# Patient Record
Sex: Female | Born: 1955 | Race: Black or African American | Hispanic: Yes | Marital: Married | Smoking: Never smoker
Health system: Southern US, Community
[De-identification: ages and names within clinical notes are randomized; demographics above are authoritative.]

## PROBLEM LIST (undated history)

## (undated) DIAGNOSIS — I38 Endocarditis, valve unspecified: Secondary | ICD-10-CM

## (undated) DIAGNOSIS — I10 Essential (primary) hypertension: Secondary | ICD-10-CM

## (undated) DIAGNOSIS — J45909 Unspecified asthma, uncomplicated: Secondary | ICD-10-CM

---

## 2021-07-13 ENCOUNTER — Encounter: Payer: Self-pay | Admitting: Emergency Medicine

## 2021-07-13 ENCOUNTER — Emergency Department: Payer: Self-pay

## 2021-07-13 ENCOUNTER — Other Ambulatory Visit: Payer: Self-pay

## 2021-07-13 ENCOUNTER — Emergency Department
Admission: EM | Admit: 2021-07-13 | Discharge: 2021-07-13 | Disposition: A | Discharge status: Home / Self Care | Payer: Self-pay | Attending: Emergency Medicine | Admitting: Emergency Medicine

## 2021-07-13 DIAGNOSIS — M25512 Pain in left shoulder: Secondary | ICD-10-CM | POA: Insufficient documentation

## 2021-07-13 HISTORY — DX: Essential (primary) hypertension: I10

## 2021-07-13 MED ORDER — ACETAMINOPHEN 325 MG PO TABS
650.0000 mg | ORAL_TABLET | Freq: Once | ORAL | Status: AC
  Administered 2021-07-13: 650 mg via ORAL
  Filled 2021-07-13: qty 2

## 2021-07-13 NOTE — Discharge Instructions (Signed)
Your MRI will not be read tonight.  Please call for an appoint with Dr. Hyacinth Meeker.  You will see the results on your [facility] health MyChart as soon as the radiologist does read the result.  Wear the sling constantly for the next 2 to 3 days.  You may take it off only to shower.  Apply ice.  Take Tylenol and ibuprofen for pain as needed.

## 2021-07-13 NOTE — ED Provider Notes (Signed)
[facility] Provider Note    Event Date/Time   First MD Initiated Contact with Patient 07/13/21 1358     (approximate)   History   Shoulder Pain   HPI  Mackenzie Mills is a 66 y.o. female with no significant past medical history presents with left arm pain.  Patient states she was leaning on the arm to lift herself up when she felt a sharp pain in the upper arm and was unable to support herself.  States she has been unable to lift the arm without actually using the other hand.  Patient is left-handed.  States she took Motrin without much relief.  No numbness or tingling.      Physical Exam   Triage Vital Signs: ED Triage Vitals  Enc Vitals Group     BP 07/13/21 1356 (!) 170/88     Pulse Rate 07/13/21 1356 86     Resp 07/13/21 1356 18     Temp 07/13/21 1356 99 F (37.2 C)     Temp src --      SpO2 07/13/21 1356 96 %     Weight --      Height --      Head Circumference --      Peak Flow --      Pain Score 07/13/21 1351 10     Pain Loc --      Pain Edu? --      Excl. in [place]? --     Most recent vital signs: Vitals:   07/13/21 1356  BP: (!) 170/88  Pulse: 86  Resp: 18  Temp: 99 F (37.2 C)  SpO2: 96%     General: Awake, no distress.   CV:  Good peripheral perfusion. regular rate and  rhythm Resp:  Normal effort.  Abd:  No distention.   Other:  Left shoulder with decreased range of motion in all planes, patient is unable to lift her arm, there is a knot noted about halfway down the humerus which could be a piece of tendon or muscle.  Neurovascular is intact.   ED Results / Procedures / Treatments   Labs (all labs ordered are listed, but only abnormal results are displayed) Labs Reviewed - No data to display   EKG     RADIOLOGY X-ray of the left shoulder MRI of the left shoulder    PROCEDURES:   Procedures   MEDICATIONS ORDERED IN ED: Medications  acetaminophen (TYLENOL) tablet 650 mg (650 mg Oral Given 07/13/21 1633)      IMPRESSION / MDM / [place] / ED COURSE  I reviewed the triage vital signs and the nursing notes.                              Differential diagnosis includes, but is not limited to, fracture, rotator cuff injury, ruptured tendon or muscle  Patient's presentation is most consistent with acute complicated illness / injury requiring diagnostic workup.   X-ray of the left shoulder interpreted by me as being negative.  Due to the patient's inability to move the shoulder I do feel there is a tear of the muscle or tendon.  We will do MRI of the left shoulder.  Patient was placed in a sling and given ice pack.  Tylenol for pain as she does not want a narcotic.  MRI of left shoulder, patient placed in shoulder immobilizer and given ice pack.  She  states she is comfortable Tylenol and ibuprofen.  Discharged stable condition.  Instructed to follow-up with Dr. Sabra Heck and that she will get her MRI results tomorrow.       FINAL CLINICAL IMPRESSION(S) / ED DIAGNOSES   Final diagnoses:  Acute pain of left shoulder     Rx / DC Orders   ED Discharge Orders     None        Note:  This document was prepared using Dragon voice recognition software and may include unintentional dictation errors.    Versie Starks, PA-C 07/13/21 2033    Carrie Mew, MD 07/19/21 [phone]

## 2021-07-13 NOTE — ED Triage Notes (Signed)
Pt reports was getting up from the ground and using her left arm to support her and all the sudden she felt a sharp pain and since then she has not been able to use her left arm and lift it. Pt reports pain is right at shoulder. Pt in sling made from t-shirt at this time. Pt reports feels better supported

## 2022-05-13 ENCOUNTER — Other Ambulatory Visit: Payer: Self-pay

## 2022-05-13 ENCOUNTER — Emergency Department: Payer: Medicare Other

## 2022-05-13 ENCOUNTER — Encounter: Payer: Self-pay | Admitting: Intensive Care

## 2022-05-13 ENCOUNTER — Emergency Department
Admission: EM | Admit: 2022-05-13 | Discharge: 2022-05-13 | Disposition: A | Discharge status: Home / Self Care | Payer: Medicare Other | Attending: Emergency Medicine | Admitting: Emergency Medicine

## 2022-05-13 DIAGNOSIS — I10 Essential (primary) hypertension: Secondary | ICD-10-CM | POA: Diagnosis not present

## 2022-05-13 DIAGNOSIS — K5730 Diverticulosis of large intestine without perforation or abscess without bleeding: Secondary | ICD-10-CM | POA: Diagnosis not present

## 2022-05-13 DIAGNOSIS — R0789 Other chest pain: Secondary | ICD-10-CM | POA: Diagnosis not present

## 2022-05-13 DIAGNOSIS — R0602 Shortness of breath: Secondary | ICD-10-CM | POA: Insufficient documentation

## 2022-05-13 DIAGNOSIS — I7090 Unspecified atherosclerosis: Secondary | ICD-10-CM | POA: Diagnosis not present

## 2022-05-13 DIAGNOSIS — R079 Chest pain, unspecified: Secondary | ICD-10-CM | POA: Diagnosis not present

## 2022-05-13 HISTORY — DX: Unspecified asthma, uncomplicated: J45.909

## 2022-05-13 HISTORY — DX: Endocarditis, valve unspecified: I38

## 2022-05-13 LAB — CBC
HCT: 34.6 % — ABNORMAL LOW (ref 36.0–46.0)
Hemoglobin: 11.2 g/dL — ABNORMAL LOW (ref 12.0–15.0)
MCH: 28.4 pg (ref 26.0–34.0)
MCHC: 32.4 g/dL (ref 30.0–36.0)
MCV: 87.6 fL (ref 80.0–100.0)
Platelets: 304 10*3/uL (ref 150–400)
RBC: 3.95 MIL/uL (ref 3.87–5.11)
RDW: 14.4 % (ref 11.5–15.5)
WBC: 5.3 10*3/uL (ref 4.0–10.5)
nRBC: 0 % (ref 0.0–0.2)

## 2022-05-13 LAB — BASIC METABOLIC PANEL
Anion gap: 10 (ref 5–15)
BUN: 14 mg/dL (ref 8–23)
CO2: 26 mmol/L (ref 22–32)
Calcium: 9.1 mg/dL (ref 8.9–10.3)
Chloride: 102 mmol/L (ref 98–111)
Creatinine, Ser: 0.71 mg/dL (ref 0.44–1.00)
GFR, Estimated: >60 mL/min (ref >60)
Glucose, Bld: 101 mg/dL — ABNORMAL HIGH (ref 70–99)
Potassium: 3.5 mmol/L (ref 3.5–5.1)
Sodium: 138 mmol/L (ref 135–145)

## 2022-05-13 LAB — URINALYSIS, ROUTINE W REFLEX MICROSCOPIC
Bilirubin Urine: NEGATIVE
Glucose, UA: NEGATIVE mg/dL
Ketones, ur: NEGATIVE mg/dL
Leukocytes,Ua: NEGATIVE
Nitrite: NEGATIVE
Protein, ur: NEGATIVE mg/dL
Specific Gravity, Urine: 1.002 — ABNORMAL LOW (ref 1.005–1.030)
Squamous Epithelial / HPF: NONE SEEN /HPF (ref 0–5)
pH: 7 (ref 5.0–8.0)

## 2022-05-13 LAB — TROPONIN I (HIGH SENSITIVITY)
Troponin I (High Sensitivity): 4 ng/L (ref <18)
Troponin I (High Sensitivity): 5 ng/L (ref <18)

## 2022-05-13 MED ORDER — LISINOPRIL-HYDROCHLOROTHIAZIDE 20-25 MG PO TABS: 1.0000 | ORAL_TABLET | Freq: Every day | ORAL | 1 refills | Status: AC

## 2022-05-13 MED ORDER — IOHEXOL 350 MG/ML SOLN
100.0000 mL | Freq: Once | INTRAVENOUS | Status: AC | PRN
  Administered 2022-05-13: 100 mL via INTRAVENOUS

## 2022-05-13 MED ORDER — PENICILLIN V POTASSIUM 500 MG PO TABS: 500.0000 mg | ORAL_TABLET | Freq: Four times a day (QID) | ORAL | 0 refills | Status: AC

## 2022-05-13 MED ORDER — HYDROCHLOROTHIAZIDE 25 MG PO TABS
25.0000 mg | ORAL_TABLET | Freq: Every day | ORAL | Status: DC
  Administered 2022-05-13: 25 mg via ORAL
  Filled 2022-05-13: qty 1

## 2022-05-13 MED ORDER — LISINOPRIL 10 MG PO TABS
20.0000 mg | ORAL_TABLET | Freq: Once | ORAL | Status: AC
  Administered 2022-05-13: 20 mg via ORAL
  Filled 2022-05-13: qty 2

## 2022-05-13 MED ORDER — LISINOPRIL 10 MG PO TABS
40.0000 mg | ORAL_TABLET | Freq: Once | ORAL | Status: DC
  Filled 2022-05-13: qty 4

## 2022-05-13 NOTE — ED Triage Notes (Signed)
Patient c/o squeezing chest pain with sob. No radiation

## 2022-05-13 NOTE — ED Notes (Signed)
Patient transported to CT 

## 2022-05-13 NOTE — ED Notes (Addendum)
Pt A&Ox4. 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 Thought it would would decrease but worsened. Denies any CP, SOB or headache at this time. Pt should be on BP meds HTZ and lisinopril but has been out of meds for a week or so. Pt also states sensitive to salt. Also states gains weight quickly from HTZ meds. BP has elevated at this time. EDP at bedside at this time. Pt currently has infected tooth in mouth and denies any pain from it at this time is due to have it removed within a weeks time.

## 2022-05-13 NOTE — ED Provider Notes (Addendum)
-----------------------------------------   5:30 PM on 05/13/2022 ----------------------------------------- Patient care assumed from Dr. Erma Heritage.  Patient's repeat troponin remains negative.  CTA of the chest abdomen pelvis is negative for aortic dissection or aneurysm.  Small umbilical hernia without obstruction.  Multiple pulmonary nodules.  Patient will follow-up with her doctor regarding these to see if any follow-up is needed.  Given the patient's reassuring workup reassuring lab work I believe the patient is safe for discharge home from an emergent standpoint.  Patient will follow-up with her primary care doctor.  Patient states she is having a dental extraction soon and has been experiencing increased dental pain.  Will cover with penicillin as a precaution.  I have also put a referral in for a primary care doctor.  Patient agreeable to plan of care.  She states she does not smoke and has never smoked, low risk will not need CT follow-up.   Minna Antis, MD 05/13/22 1737

## 2022-05-13 NOTE — ED Provider Notes (Addendum)
[facility] Provider Note    Event Date/Time   First MD Initiated Contact with Patient 05/13/22 1332     (approximate)   History   Chest Pain   HPI  Mackenzie Mills is a 67 y.o. female  here with chest pain. Pt reports that earlier today at work, she experienced gradual onset of aching, throbbing chest pain and pressure. She states she has been out of her BP meds x 1-2 weeks. She states that it was a squeezing like sensation. She took her BP and It was 170s/100s which is very high for her. Tried to rest but remained elevated so she is here for evaluation. Denies any current CP, SOB. No focal numbness or weakness. H/o HTN but has not had a PCP since moving here 2-3 years ago. No other complaints.      Physical Exam   Triage Vital Signs: ED Triage Vitals  Enc Vitals Group     BP 05/13/22 1314 (!) 165/87     Pulse Rate 05/13/22 1314 89     Resp 05/13/22 1314 16     Temp 05/13/22 1314 98.5 F (36.9 C)     Temp Source 05/13/22 1314 Oral     SpO2 05/13/22 1314 98 %     Weight 05/13/22 1309 167 lb (75.8 kg)     Height 05/13/22 1309 5' 4.5" (1.638 m)     Head Circumference --      Peak Flow --      Pain Score 05/13/22 1309 8     Pain Loc --      Pain Edu? --      Excl. in [place]? --     Most recent vital signs: Vitals:   05/13/22 1352 05/13/22 1400  BP:  (!) 178/88  Pulse:  87  Resp:    Temp:    SpO2: 99% 98%     General: Awake, no distress.  CV:  Good peripheral perfusion. RRR. Resp:  Normal work of breathing. Lungs clear. Abd:  No distention. No tenderness. Other:  No focal neuro deficits. Pulses 2+ and symmetric.   ED Results / Procedures / Treatments   Labs (all labs ordered are listed, but only abnormal results are displayed) Labs Reviewed  BASIC METABOLIC PANEL - Abnormal; Notable for the following components:      Result Value   Glucose, Bld 101 (*)    All other components within normal limits  CBC - Abnormal; Notable for the  following components:   Hemoglobin 11.2 (*)    HCT 34.6 (*)    All other components within normal limits  URINALYSIS, ROUTINE W REFLEX MICROSCOPIC  TROPONIN I (HIGH SENSITIVITY)  TROPONIN I (HIGH SENSITIVITY)     EKG Sinus rhythm, VR 90. PR 154, QRS 86, QTc 445. No acute St elevations or depressions. No ischemia or infarct.   RADIOLOGY CXR: Clear   I also independently reviewed and agree with radiologist interpretations.   PROCEDURES:  Critical Care performed: No  .1-3 Lead EKG Interpretation  Performed by: Shaune Pollack, MD Authorized by: Shaune Pollack, MD     Interpretation: normal     ECG rate:  80-90   ECG rate assessment: normal     Rhythm: sinus rhythm     Ectopy: none     Conduction: normal   Comments:     Indication: Chest pain     MEDICATIONS ORDERED IN ED: Medications  hydrochlorothiazide (HYDRODIURIL) tablet 25 mg (has no administration in time  range)  lisinopril (ZESTRIL) tablet 20 mg (has no administration in time range)     IMPRESSION / MDM / ASSESSMENT AND PLAN / ED COURSE  I reviewed the triage vital signs and the nursing notes.                              Differential diagnosis includes, but is not limited to, symptomatic HTN, ACS, GERD, anemia, anxiety, arrhythmia  Patient's presentation is most consistent with acute presentation with potential threat to life or bodily function.  The patient is on the cardiac monitor to evaluate for evidence of arrhythmia and/or significant heart rate changes  67 yo F with h/o HTN here with atypical chest pain. Suspect symptomatic HTN vs atypical chest pain. No signs of ST elevation. Initial trop negative. CXR is clear. CBC, BMP unremarkable.  Will plan to repeat trop, dc with antiHTN if pain remains under control and BP improved. Pt in agreement. Pain is not sharp or tearing, pulses symmetric, do not suspect dissection. No hypoxia or signs of PE.    Of note, pt also has a h/o poor dentition and  has an active dental infection. Upcoming removal in 1 week. She has a h/o valvular disease of unknown severity. Will start on empiric abx.  FINAL CLINICAL IMPRESSION(S) / ED DIAGNOSES   Final diagnoses:  Atypical chest pain     Rx / DC Orders   ED Discharge Orders     None        Note:  This document was prepared using Dragon voice recognition software and may include unintentional dictation errors.   Shaune Pollack, MD 05/13/22 1448    Shaune Pollack, MD 05/13/22 [phone]

## 2022-05-13 NOTE — Discharge Instructions (Signed)
Please follow-up with your doctor for recheck/reevaluation.  Return to the emergency department for any return of/worsening chest pain, trouble breathing, or any other symptom personally concerning to yourself.

## 2022-05-19 DIAGNOSIS — H04123 Dry eye syndrome of bilateral lacrimal glands: Secondary | ICD-10-CM | POA: Diagnosis not present

## 2022-05-20 DIAGNOSIS — J00 Acute nasopharyngitis [common cold]: Secondary | ICD-10-CM | POA: Diagnosis not present

## 2022-05-20 DIAGNOSIS — R21 Rash and other nonspecific skin eruption: Secondary | ICD-10-CM | POA: Diagnosis not present

## 2022-06-02 DIAGNOSIS — D509 Iron deficiency anemia, unspecified: Secondary | ICD-10-CM | POA: Diagnosis not present

## 2022-06-02 DIAGNOSIS — M25561 Pain in right knee: Secondary | ICD-10-CM | POA: Diagnosis not present

## 2022-06-02 DIAGNOSIS — R918 Other nonspecific abnormal finding of lung field: Secondary | ICD-10-CM | POA: Diagnosis not present

## 2022-06-02 DIAGNOSIS — Z1211 Encounter for screening for malignant neoplasm of colon: Secondary | ICD-10-CM | POA: Diagnosis not present

## 2022-06-02 DIAGNOSIS — R0789 Other chest pain: Secondary | ICD-10-CM | POA: Diagnosis not present

## 2022-06-02 DIAGNOSIS — I10 Essential (primary) hypertension: Secondary | ICD-10-CM | POA: Diagnosis not present

## 2022-06-02 DIAGNOSIS — E559 Vitamin D deficiency, unspecified: Secondary | ICD-10-CM | POA: Diagnosis not present

## 2022-06-02 DIAGNOSIS — E538 Deficiency of other specified B group vitamins: Secondary | ICD-10-CM | POA: Diagnosis not present

## 2022-06-02 DIAGNOSIS — K429 Umbilical hernia without obstruction or gangrene: Secondary | ICD-10-CM | POA: Diagnosis not present

## 2022-06-02 DIAGNOSIS — Z8739 Personal history of other diseases of the musculoskeletal system and connective tissue: Secondary | ICD-10-CM | POA: Diagnosis not present

## 2022-06-02 DIAGNOSIS — Z0000 Encounter for general adult medical examination without abnormal findings: Secondary | ICD-10-CM | POA: Diagnosis not present

## 2022-06-02 DIAGNOSIS — J4520 Mild intermittent asthma, uncomplicated: Secondary | ICD-10-CM | POA: Diagnosis not present

## 2022-06-10 DIAGNOSIS — R0600 Dyspnea, unspecified: Secondary | ICD-10-CM | POA: Diagnosis not present

## 2022-06-10 DIAGNOSIS — I700 Atherosclerosis of aorta: Secondary | ICD-10-CM | POA: Diagnosis not present

## 2022-06-10 DIAGNOSIS — I2089 Other forms of angina pectoris: Secondary | ICD-10-CM | POA: Diagnosis not present

## 2022-06-10 DIAGNOSIS — R002 Palpitations: Secondary | ICD-10-CM | POA: Diagnosis not present

## 2022-06-10 DIAGNOSIS — R296 Repeated falls: Secondary | ICD-10-CM | POA: Diagnosis not present

## 2022-06-12 DIAGNOSIS — Z1211 Encounter for screening for malignant neoplasm of colon: Secondary | ICD-10-CM | POA: Diagnosis not present

## 2022-06-17 DIAGNOSIS — M170 Bilateral primary osteoarthritis of knee: Secondary | ICD-10-CM | POA: Diagnosis not present

## 2022-06-17 DIAGNOSIS — M25562 Pain in left knee: Secondary | ICD-10-CM | POA: Diagnosis not present

## 2022-06-17 DIAGNOSIS — M19072 Primary osteoarthritis, left ankle and foot: Secondary | ICD-10-CM | POA: Diagnosis not present

## 2022-06-17 DIAGNOSIS — M25572 Pain in left ankle and joints of left foot: Secondary | ICD-10-CM | POA: Diagnosis not present

## 2022-06-17 DIAGNOSIS — Z780 Asymptomatic menopausal state: Secondary | ICD-10-CM | POA: Diagnosis not present

## 2022-06-17 DIAGNOSIS — M25561 Pain in right knee: Secondary | ICD-10-CM | POA: Diagnosis not present

## 2022-06-22 DIAGNOSIS — R002 Palpitations: Secondary | ICD-10-CM | POA: Diagnosis not present

## 2022-07-07 DIAGNOSIS — E538 Deficiency of other specified B group vitamins: Secondary | ICD-10-CM | POA: Diagnosis not present

## 2022-07-07 DIAGNOSIS — D509 Iron deficiency anemia, unspecified: Secondary | ICD-10-CM | POA: Diagnosis not present

## 2022-07-07 DIAGNOSIS — Z8739 Personal history of other diseases of the musculoskeletal system and connective tissue: Secondary | ICD-10-CM | POA: Diagnosis not present

## 2022-07-07 DIAGNOSIS — I10 Essential (primary) hypertension: Secondary | ICD-10-CM | POA: Diagnosis not present

## 2022-07-07 DIAGNOSIS — E559 Vitamin D deficiency, unspecified: Secondary | ICD-10-CM | POA: Diagnosis not present

## 2022-07-07 DIAGNOSIS — R7309 Other abnormal glucose: Secondary | ICD-10-CM | POA: Diagnosis not present

## 2022-07-21 DIAGNOSIS — M170 Bilateral primary osteoarthritis of knee: Secondary | ICD-10-CM | POA: Diagnosis not present

## 2022-08-25 DIAGNOSIS — R0602 Shortness of breath: Secondary | ICD-10-CM | POA: Diagnosis not present

## 2022-08-25 DIAGNOSIS — R002 Palpitations: Secondary | ICD-10-CM | POA: Diagnosis not present

## 2022-08-25 DIAGNOSIS — R079 Chest pain, unspecified: Secondary | ICD-10-CM | POA: Diagnosis not present

## 2022-08-25 DIAGNOSIS — I700 Atherosclerosis of aorta: Secondary | ICD-10-CM | POA: Diagnosis not present

## 2022-08-25 DIAGNOSIS — R011 Cardiac murmur, unspecified: Secondary | ICD-10-CM | POA: Diagnosis not present

## 2022-08-25 DIAGNOSIS — I10 Essential (primary) hypertension: Secondary | ICD-10-CM | POA: Diagnosis not present

## 2022-09-03 ENCOUNTER — Other Ambulatory Visit: Payer: Self-pay

## 2022-09-03 ENCOUNTER — Emergency Department
Admission: EM | Admit: 2022-09-03 | Discharge: 2022-09-03 | Disposition: A | Discharge status: Home / Self Care | Payer: Medicare Other | Attending: Emergency Medicine | Admitting: Emergency Medicine

## 2022-09-03 ENCOUNTER — Emergency Department: Payer: Medicare Other

## 2022-09-03 DIAGNOSIS — U071 COVID-19: Secondary | ICD-10-CM | POA: Insufficient documentation

## 2022-09-03 DIAGNOSIS — J45909 Unspecified asthma, uncomplicated: Secondary | ICD-10-CM | POA: Diagnosis not present

## 2022-09-03 DIAGNOSIS — R059 Cough, unspecified: Secondary | ICD-10-CM | POA: Diagnosis present

## 2022-09-03 DIAGNOSIS — R0602 Shortness of breath: Secondary | ICD-10-CM | POA: Diagnosis not present

## 2022-09-03 DIAGNOSIS — R000 Tachycardia, unspecified: Secondary | ICD-10-CM | POA: Diagnosis not present

## 2022-09-03 DIAGNOSIS — I517 Cardiomegaly: Secondary | ICD-10-CM | POA: Diagnosis not present

## 2022-09-03 DIAGNOSIS — E872 Acidosis, unspecified: Secondary | ICD-10-CM | POA: Diagnosis not present

## 2022-09-03 LAB — CBC WITH DIFFERENTIAL/PLATELET
Abs Immature Granulocytes: 0.02 10*3/uL (ref 0.00–0.07)
Basophils Absolute: 0 10*3/uL (ref 0.0–0.1)
Basophils Relative: 0 %
Eosinophils Absolute: 0 10*3/uL (ref 0.0–0.5)
Eosinophils Relative: 0 %
HCT: 34.6 % — ABNORMAL LOW (ref 36.0–46.0)
Hemoglobin: 11.5 g/dL — ABNORMAL LOW (ref 12.0–15.0)
Immature Granulocytes: 0 %
Lymphocytes Relative: 5 %
Lymphs Abs: 0.3 10*3/uL — ABNORMAL LOW (ref 0.7–4.0)
MCH: 28.3 pg (ref 26.0–34.0)
MCHC: 33.2 g/dL (ref 30.0–36.0)
MCV: 85.2 fL (ref 80.0–100.0)
Monocytes Absolute: 0.1 10*3/uL (ref 0.1–1.0)
Monocytes Relative: 1 %
Neutro Abs: 6.5 10*3/uL (ref 1.7–7.7)
Neutrophils Relative %: 94 %
Platelets: 303 10*3/uL (ref 150–400)
RBC: 4.06 MIL/uL (ref 3.87–5.11)
RDW: 14.2 % (ref 11.5–15.5)
WBC: 7 10*3/uL (ref 4.0–10.5)
nRBC: 0 % (ref 0.0–0.2)

## 2022-09-03 LAB — URINALYSIS, W/ REFLEX TO CULTURE (INFECTION SUSPECTED)
Bilirubin Urine: NEGATIVE
Glucose, UA: NEGATIVE mg/dL
Ketones, ur: NEGATIVE mg/dL
Leukocytes,Ua: NEGATIVE
Nitrite: NEGATIVE
Protein, ur: NEGATIVE mg/dL
Specific Gravity, Urine: 1.008 (ref 1.005–1.030)
pH: 5 (ref 5.0–8.0)

## 2022-09-03 LAB — SARS CORONAVIRUS 2 BY RT PCR: SARS Coronavirus 2 by RT PCR: POSITIVE — AB

## 2022-09-03 LAB — COMPREHENSIVE METABOLIC PANEL
ALT: 18 U/L (ref 0–44)
AST: 31 U/L (ref 15–41)
Albumin: 3.8 g/dL (ref 3.5–5.0)
Alkaline Phosphatase: 54 U/L (ref 38–126)
Anion gap: 12 (ref 5–15)
BUN: 15 mg/dL (ref 8–23)
CO2: 25 mmol/L (ref 22–32)
Calcium: 9.1 mg/dL (ref 8.9–10.3)
Chloride: 98 mmol/L (ref 98–111)
Creatinine, Ser: 0.99 mg/dL (ref 0.44–1.00)
GFR, Estimated: >60 mL/min (ref >60)
Glucose, Bld: 207 mg/dL — ABNORMAL HIGH (ref 70–99)
Potassium: 3.4 mmol/L — ABNORMAL LOW (ref 3.5–5.1)
Sodium: 135 mmol/L (ref 135–145)
Total Bilirubin: 0.4 mg/dL (ref 0.3–1.2)
Total Protein: 8.1 g/dL (ref 6.5–8.1)

## 2022-09-03 LAB — LACTIC ACID, PLASMA
Lactic Acid, Venous: 4 mmol/L (ref 0.5–1.9)
Lactic Acid, Venous: 3.7 mmol/L (ref 0.5–1.9)
Lactic Acid, Venous: 3.9 mmol/L (ref 0.5–1.9)

## 2022-09-03 MED ORDER — LACTATED RINGERS IV BOLUS
1000.0000 mL | Freq: Once | INTRAVENOUS | Status: AC
  Administered 2022-09-03: 1000 mL via INTRAVENOUS

## 2022-09-03 MED ORDER — IPRATROPIUM-ALBUTEROL 0.5-2.5 (3) MG/3ML IN SOLN
3.0000 mL | Freq: Once | RESPIRATORY_TRACT | Status: AC
  Administered 2022-09-03: 3 mL via RESPIRATORY_TRACT
  Filled 2022-09-03: qty 3

## 2022-09-03 MED ORDER — ACETAMINOPHEN 500 MG PO TABS
1000.0000 mg | ORAL_TABLET | Freq: Once | ORAL | Status: AC
  Administered 2022-09-03: 1000 mg via ORAL
  Filled 2022-09-03: qty 2

## 2022-09-03 NOTE — ED Provider Notes (Signed)
[facility] Provider Note    Event Date/Time   First MD Initiated Contact with Patient 09/03/22 1614     (approximate)   History   Cough, Shortness of Breath, and Fatigue   HPI  Mackenzie Mills is a 67 y.o. female who presents to the emergency department today because of concerns for shortness of breath, cough and fatigue.  Symptoms started roughly 5 days ago.  Patient does have a history of asthma so has tried her inhaler with minimal relief.  In addition to the symptoms she also has felt increased urinary urgency.  She states that she was up and down to the bathroom multiple times last night.     Physical Exam   Triage Vital Signs: ED Triage Vitals  Encounter Vitals Group     BP 09/03/22 1607 (!) 151/89     Systolic BP Percentile --      Diastolic BP Percentile --      Pulse Rate 09/03/22 1607 (!) 117     Resp 09/03/22 1607 17     Temp 09/03/22 1607 100 F (37.8 C)     Temp Source 09/03/22 1607 Oral     SpO2 09/03/22 1607 97 %     Weight 09/03/22 1609 167 lb (75.8 kg)     Height 09/03/22 1609 5\' 4"  (1.626 m)     Head Circumference --      Peak Flow --      Pain Score 09/03/22 1608 0     Pain Loc --      Pain Education --      Exclude from Growth Chart --     Most recent vital signs: Vitals:   09/03/22 1607  BP: (!) 151/89  Pulse: (!) 117  Resp: 17  Temp: 100 F (37.8 C)  SpO2: 97%   General: Awake, alert, oriented. CV:  Good peripheral perfusion. Tachycardia. Resp:  Normal effort. Lungs clear. Abd:  No distention.    ED Results / Procedures / Treatments   Labs (all labs ordered are listed, but only abnormal results are displayed) Labs Reviewed  SARS CORONAVIRUS 2 BY RT PCR - Abnormal; Notable for the following components:      Result Value   SARS Coronavirus 2 by RT PCR POSITIVE (*)    All other components within normal limits  COMPREHENSIVE METABOLIC PANEL - Abnormal; Notable for the following components:   Potassium 3.4  (*)    Glucose, Bld 207 (*)    All other components within normal limits  CBC WITH DIFFERENTIAL/PLATELET - Abnormal; Notable for the following components:   Hemoglobin 11.5 (*)    HCT 34.6 (*)    Lymphs Abs 0.3 (*)    All other components within normal limits  CULTURE, BLOOD (ROUTINE X 2)  CULTURE, BLOOD (ROUTINE X 2)  LACTIC ACID, PLASMA  LACTIC ACID, PLASMA  URINALYSIS, W/ REFLEX TO CULTURE (INFECTION SUSPECTED)     EKG  I, Phineas Semen, attending physician, personally viewed and interpreted this EKG  EKG Time: 1559 Rate: 118 Rhythm: sinus tachycardia Axis: normal Intervals: qtc 487 QRS: narrow, q waves II, III aVF ST changes: no st elevation Impression: abnormal ekg    RADIOLOGY I independently interpreted and visualized the CXR. My interpretation: No pneumonia Radiology interpretation:  IMPRESSION:  1. No acute cardiopulmonary process.  2. Mild to moderate cardiomegaly.      PROCEDURES:  Critical Care performed: No    MEDICATIONS ORDERED IN ED: Medications - No data  to display   IMPRESSION / MDM / ASSESSMENT AND PLAN / ED COURSE  I reviewed the triage vital signs and the nursing notes.                              Differential diagnosis includes, but is not limited to, pneumonia, COVID, viral URI, asthma  Patient's presentation is most consistent with acute presentation with potential threat to life or bodily function.   The patient is on the cardiac monitor to evaluate for evidence of arrhythmia and/or significant heart rate changes.  Patient presented to the emergency department today because of concerns for cough, shortness of breath and weakness for roughly 5 days.  On exam lungs were clear.  Blood work without leukocytosis.  COVID-positive.  Will give DuoNeb treatments.   Patient did feel better after DuoNeb treatments.  However patient's lactic acid did come back significantly elevated.  Did give IV fluids however repeat lactic still  without significant improvement.  I did have a discussion with patient given concern for possible significant infection or dehydration.  However patient was adamant that she felt significantly improved and would like to be discharged home.  She states that she has an appointment scheduled for 10:00 tomorrow.  Did discuss strict return precautions.      FINAL CLINICAL IMPRESSION(S) / ED DIAGNOSES   Final diagnoses:  COVID-19  Lactic acidosis      Note:  This document was prepared using Dragon voice recognition software and may include unintentional dictation errors.    Phineas Semen, MD 09/03/22 [phone]

## 2022-09-03 NOTE — ED Triage Notes (Signed)
Pt arrives via POV w/ c/o fatigue, shortness of breath, and cough that started Monday.

## 2022-09-03 NOTE — Discharge Instructions (Signed)
Please seek medical attention for any high fevers, chest pain, shortness of breath, change in behavior, persistent vomiting, bloody stool or any other new or concerning symptoms.  

## 2022-09-03 NOTE — ED Notes (Signed)
Patient ambulatory to toilet without assistance to give urine sample.

## 2022-09-04 DIAGNOSIS — J208 Acute bronchitis due to other specified organisms: Secondary | ICD-10-CM | POA: Diagnosis not present

## 2022-09-04 DIAGNOSIS — R7989 Other specified abnormal findings of blood chemistry: Secondary | ICD-10-CM | POA: Diagnosis not present

## 2022-09-04 DIAGNOSIS — U071 COVID-19: Secondary | ICD-10-CM | POA: Diagnosis not present

## 2022-09-04 DIAGNOSIS — J4521 Mild intermittent asthma with (acute) exacerbation: Secondary | ICD-10-CM | POA: Diagnosis not present

## 2022-09-11 ENCOUNTER — Telehealth: Payer: Self-pay | Admitting: *Deleted

## 2022-09-11 DIAGNOSIS — I10 Essential (primary) hypertension: Secondary | ICD-10-CM | POA: Diagnosis not present

## 2022-09-11 DIAGNOSIS — R0789 Other chest pain: Secondary | ICD-10-CM | POA: Diagnosis not present

## 2022-09-11 DIAGNOSIS — I700 Atherosclerosis of aorta: Secondary | ICD-10-CM | POA: Diagnosis not present

## 2022-09-11 NOTE — Telephone Encounter (Signed)
Transition Care Management Unsuccessful Follow-up Telephone Call  Date of discharge and from where:  [facility] 09/03/2022  Attempts:  2nd Attempt  Reason for unsuccessful TCM follow-up call:  No answer/busy

## 2022-09-24 DIAGNOSIS — J208 Acute bronchitis due to other specified organisms: Secondary | ICD-10-CM | POA: Diagnosis not present

## 2022-09-24 DIAGNOSIS — R7303 Prediabetes: Secondary | ICD-10-CM | POA: Diagnosis not present

## 2022-09-24 DIAGNOSIS — U071 COVID-19: Secondary | ICD-10-CM | POA: Diagnosis not present

## 2022-09-24 DIAGNOSIS — R0789 Other chest pain: Secondary | ICD-10-CM | POA: Diagnosis not present

## 2022-09-28 DIAGNOSIS — I10 Essential (primary) hypertension: Secondary | ICD-10-CM | POA: Diagnosis not present

## 2022-09-28 DIAGNOSIS — I700 Atherosclerosis of aorta: Secondary | ICD-10-CM | POA: Diagnosis not present

## 2022-09-28 DIAGNOSIS — I38 Endocarditis, valve unspecified: Secondary | ICD-10-CM | POA: Diagnosis not present

## 2022-09-28 DIAGNOSIS — I517 Cardiomegaly: Secondary | ICD-10-CM | POA: Diagnosis not present

## 2022-09-28 DIAGNOSIS — I119 Hypertensive heart disease without heart failure: Secondary | ICD-10-CM | POA: Diagnosis not present

## 2022-09-28 DIAGNOSIS — R079 Chest pain, unspecified: Secondary | ICD-10-CM | POA: Diagnosis not present

## 2022-10-13 DIAGNOSIS — J4520 Mild intermittent asthma, uncomplicated: Secondary | ICD-10-CM | POA: Diagnosis not present

## 2022-10-13 DIAGNOSIS — M545 Low back pain, unspecified: Secondary | ICD-10-CM | POA: Diagnosis not present

## 2022-12-13 ENCOUNTER — Other Ambulatory Visit: Payer: Self-pay

## 2022-12-13 ENCOUNTER — Emergency Department
Admission: EM | Admit: 2022-12-13 | Discharge: 2022-12-13 | Disposition: A | Discharge status: Home / Self Care | Payer: Medicare Other | Attending: Emergency Medicine | Admitting: Emergency Medicine

## 2022-12-13 ENCOUNTER — Emergency Department: Payer: Medicare Other

## 2022-12-13 DIAGNOSIS — I10 Essential (primary) hypertension: Secondary | ICD-10-CM | POA: Insufficient documentation

## 2022-12-13 DIAGNOSIS — J45909 Unspecified asthma, uncomplicated: Secondary | ICD-10-CM | POA: Insufficient documentation

## 2022-12-13 DIAGNOSIS — I771 Stricture of artery: Secondary | ICD-10-CM | POA: Diagnosis not present

## 2022-12-13 DIAGNOSIS — I517 Cardiomegaly: Secondary | ICD-10-CM | POA: Diagnosis not present

## 2022-12-13 DIAGNOSIS — R079 Chest pain, unspecified: Secondary | ICD-10-CM | POA: Insufficient documentation

## 2022-12-13 DIAGNOSIS — R55 Syncope and collapse: Secondary | ICD-10-CM | POA: Diagnosis not present

## 2022-12-13 DIAGNOSIS — R002 Palpitations: Secondary | ICD-10-CM | POA: Diagnosis not present

## 2022-12-13 DIAGNOSIS — I6782 Cerebral ischemia: Secondary | ICD-10-CM | POA: Diagnosis not present

## 2022-12-13 LAB — CBC
HCT: 35 % — ABNORMAL LOW (ref 36.0–46.0)
Hemoglobin: 11.4 g/dL — ABNORMAL LOW (ref 12.0–15.0)
MCH: 28.4 pg (ref 26.0–34.0)
MCHC: 32.6 g/dL (ref 30.0–36.0)
MCV: 87.1 fL (ref 80.0–100.0)
Platelets: 306 10*3/uL (ref 150–400)
RBC: 4.02 MIL/uL (ref 3.87–5.11)
RDW: 13.8 % (ref 11.5–15.5)
WBC: 4.9 10*3/uL (ref 4.0–10.5)
nRBC: 0 % (ref 0.0–0.2)

## 2022-12-13 LAB — CBG MONITORING, ED: Glucose-Capillary: 111 mg/dL — ABNORMAL HIGH (ref 70–99)

## 2022-12-13 LAB — BASIC METABOLIC PANEL
Anion gap: 7 (ref 5–15)
BUN: 12 mg/dL (ref 8–23)
CO2: 30 mmol/L (ref 22–32)
Calcium: 9.3 mg/dL (ref 8.9–10.3)
Chloride: 100 mmol/L (ref 98–111)
Creatinine, Ser: 0.8 mg/dL (ref 0.44–1.00)
GFR, Estimated: >60 mL/min (ref >60)
Glucose, Bld: 105 mg/dL — ABNORMAL HIGH (ref 70–99)
Potassium: 3.4 mmol/L — ABNORMAL LOW (ref 3.5–5.1)
Sodium: 137 mmol/L (ref 135–145)

## 2022-12-13 LAB — TROPONIN I (HIGH SENSITIVITY)
Troponin I (High Sensitivity): 4 ng/L (ref <18)
Troponin I (High Sensitivity): 5 ng/L (ref <18)

## 2022-12-13 NOTE — ED Provider Notes (Signed)
[facility] Provider Note    Event Date/Time   First MD Initiated Contact with Patient 12/13/22 1143     (approximate)   History   Chest Pain   HPI  Mackenzie Mills is a 67 y.o. female history of asthma and hypertension  She has noticed a slight feeling of a fluttering feeling intermittently in her chest now off and on for quite some time, last night also noticed a slight fluttering feeling.  It lasted perhaps 20 seconds.  This morning she was preparing for her church community to leave the house when she started feeling the fluttering followed by lightheadedness and nearly "passed out".  She called for her husband.  She did not fall or suffer injury.  She had to go down to the floor, and checks her blood pressure not long after and reports it was normal in the 140 range.  She felt a little lightheaded as well and  She denies having any chest pain.  No shortness of breath.  She reports rather it feels like a lightheadedness that had come about her and a slight fluttering feeling in the chest that happens.    Patient reports she has also had follow-up with [facility] cardiology.  It appears that she had a recent echo cardiogram, also had exercise stress testing that was reassuring.  Cardiology has been working to minimize her risk for atherosclerotic heart disease but she does not carry a history of known coronary disease or dysrhythmia  There is no nausea or vomiting.  No abdominal pain.  No lower extremity swelling.  No history of blood clots.  Physical Exam   Triage Vital Signs: ED Triage Vitals [12/13/22 1051]  Encounter Vitals Group     BP (!) 163/86     Systolic BP Percentile      Diastolic BP Percentile      Pulse Rate 90     Resp 18     Temp 98.1 F (36.7 C)     Temp src      SpO2 100 %     Weight 163 lb (73.9 kg)     Height 5\' 4"  (1.626 m)     Head Circumference      Peak Flow      Pain Score 0     Pain Loc      Pain Education      Exclude  from Growth Chart     Most recent vital signs: Vitals:   12/13/22 1051 12/13/22 1430  BP: (!) 163/86 (!) 140/87  Pulse: 90 91  Resp: 18 17  Temp: 98.1 F (36.7 C) 97.9 F (36.6 C)  SpO2: 100% 96%     General: Awake, no distress.  Normocephalic atraumatic.  Cranial nerve examination grossly intact including normal smile normal extraocular movements.  Speech is very clear and oriented.  No pronator drift in any extremity.  Normal sensation across the face arms and legs. CV:  Good peripheral perfusion.  Normal tones and rate.  Reviewed telemetry tracing as well and no cardiac dysrhythmias noted on telemetry Resp:  Normal effort.  Clear bilateral Abd:  No distention.  Soft nontender nondistended Other:  No pedal edema, no lateral edema   ED Results / Procedures / Treatments   Labs (all labs ordered are listed, but only abnormal results are displayed) Labs Reviewed  BASIC METABOLIC PANEL - Abnormal; Notable for the following components:      Result Value   Potassium 3.4 (*)  Glucose, Bld 105 (*)    All other components within normal limits  CBC - Abnormal; Notable for the following components:   Hemoglobin 11.4 (*)    HCT 35.0 (*)    All other components within normal limits  CBG MONITORING, ED - Abnormal; Notable for the following components:   Glucose-Capillary 111 (*)    All other components within normal limits  TROPONIN I (HIGH SENSITIVITY)  TROPONIN I (HIGH SENSITIVITY)   Initial troponin is normal.  Repeat troponin is normal  Normal chemistry panel with exception of very mild hypokalemia.  Mild anemia-chronic  EKG  Interpreted by me at 11 AM heart rate 80 QRS 90 QTc 430 Normal sinus rhythm, no evidence of acute ischemia   RADIOLOGY  Chest x-ray interpreted by me as negative for acute finding.  Cardiomegaly is present, chronic   PROCEDURES:  Critical Care performed: No  Procedures   MEDICATIONS ORDERED IN ED: Medications - No data to  display   IMPRESSION / MDM / ASSESSMENT AND PLAN / ED COURSE  I reviewed the triage vital signs and the nursing notes.                              Differential diagnosis includes, but is not limited to, orthostasis, episode of hypotension, dysrhythmia, less likely cause such as ACS.  She has no major identifiable risk factors for thromboembolism has no associated chest pain leg swelling or obvious risk factor that would suggest that as potential cause.  Her workup today is quite reassuring her ECG with normal sinus rhythm her telemetry tracing here reviewed does not show any obvious dysrhythmia.  She did report a fluttering sensation at times  off and on now for quite some time, there was a slight feeling of fluttering as well associated with today's near syncopal episode.  She did not suffer a fall or injury.  I discussed her case with her cardiology team, and Dr.Custovik and after discussing her case clinical history current workup she advises follow-up plan to have the patient see her Wednesday in clinic for consideration of cardiac monitoring/Holter monitoring.  Patient is currently awake alert has been up and ambulated, reports he feels well at this time with no further symptoms.  Discussed further plan of care and follow-up with cardiology team and she is very agreeable.  The interim we will take careful precautions.  Suspect most likely orthostatic or hypotensive type episode that is now resolved, but also consideration for arrhythmia is strongly considered.  She has no central neurologic abnormality very reassuring examination chest pain and reassuring workup  Patient's presentation is most consistent with acute complicated illness / injury requiring diagnostic workup.   The patient is on the cardiac monitor to evaluate for evidence of arrhythmia and/or significant heart rate changes.       FINAL CLINICAL IMPRESSION(S) / ED DIAGNOSES   Final diagnoses:  Near syncope   Palpitation   Rx / DC Orders   ED Discharge Orders          Ordered    Ambulatory referral to Cardiology        12/13/22 1354             Note:  This document was prepared using Dragon voice recognition software and may include unintentional dictation errors.   Sharyn Creamer, MD 12/13/22 [phone]

## 2022-12-13 NOTE — ED Notes (Signed)
EDP at bedside  

## 2022-12-13 NOTE — ED Triage Notes (Addendum)
Pt comes with c/o cp that started at 4am. Pt states it did go away and then later it came back. Pt states she later felt feeling like she was going to pass out. Pt states she felt something was happening with her heart then and not sure if PVC. Pt states she does have heart hx  Pt states chest pain is mid sternal when it happens.  Pt states also not sure if it was stroke like. Pt states no dizziness, slurred speech, blurry vision or numbness. Pt states she just felt like her brain wasn't doing something right.

## 2022-12-13 NOTE — ED Notes (Signed)
Pt walked self to hall bathroom. Refused to use toilet in room. Pt noted to have steady gait.

## 2022-12-13 NOTE — ED Notes (Signed)
Pt walked back to room with steady gait

## 2022-12-21 DIAGNOSIS — I517 Cardiomegaly: Secondary | ICD-10-CM | POA: Diagnosis not present

## 2022-12-21 DIAGNOSIS — I700 Atherosclerosis of aorta: Secondary | ICD-10-CM | POA: Diagnosis not present

## 2022-12-21 DIAGNOSIS — R079 Chest pain, unspecified: Secondary | ICD-10-CM | POA: Diagnosis not present

## 2022-12-21 DIAGNOSIS — R55 Syncope and collapse: Secondary | ICD-10-CM | POA: Diagnosis not present

## 2022-12-21 DIAGNOSIS — I10 Essential (primary) hypertension: Secondary | ICD-10-CM | POA: Diagnosis not present

## 2022-12-21 DIAGNOSIS — I771 Stricture of artery: Secondary | ICD-10-CM | POA: Diagnosis not present

## 2022-12-30 ENCOUNTER — Telehealth: Payer: Self-pay

## 2022-12-30 DIAGNOSIS — R55 Syncope and collapse: Secondary | ICD-10-CM | POA: Diagnosis not present

## 2022-12-30 DIAGNOSIS — R079 Chest pain, unspecified: Secondary | ICD-10-CM | POA: Diagnosis not present

## 2022-12-30 NOTE — Telephone Encounter (Signed)
Transition Care Management Unsuccessful Follow-up Telephone Call  Date of discharge and from where:  12/13/2022 [facility]  Attempts:  1st Attempt  Reason for unsuccessful TCM follow-up call:  Left voice message  [place] Toor [facility]  [facility], [facility] Resource Care Guide Direct Dial: [phone]  Website: [email or web address]

## 2023-01-04 ENCOUNTER — Telehealth: Payer: Self-pay

## 2023-01-04 NOTE — Telephone Encounter (Signed)
Transition Care Management Unsuccessful Follow-up Telephone Call  Date of discharge and from where:  12/13/2022 [facility]  Attempts:  2nd Attempt  Reason for unsuccessful TCM follow-up call:  Left voice message  Videl Nobrega [facility]  [facility], [facility] Resource Care Guide Direct Dial: [phone]  Website: [email or web address]

## 2023-01-07 DIAGNOSIS — R55 Syncope and collapse: Secondary | ICD-10-CM | POA: Diagnosis not present

## 2023-05-13 DIAGNOSIS — M170 Bilateral primary osteoarthritis of knee: Secondary | ICD-10-CM | POA: Diagnosis not present

## 2023-05-13 DIAGNOSIS — M25561 Pain in right knee: Secondary | ICD-10-CM | POA: Diagnosis not present

## 2023-05-13 DIAGNOSIS — M25551 Pain in right hip: Secondary | ICD-10-CM | POA: Diagnosis not present

## 2023-05-31 DIAGNOSIS — M51362 Other intervertebral disc degeneration, lumbar region with discogenic back pain and lower extremity pain: Secondary | ICD-10-CM | POA: Diagnosis not present

## 2023-05-31 DIAGNOSIS — M7061 Trochanteric bursitis, right hip: Secondary | ICD-10-CM | POA: Diagnosis not present

## 2023-05-31 DIAGNOSIS — M25551 Pain in right hip: Secondary | ICD-10-CM | POA: Diagnosis not present

## 2023-05-31 DIAGNOSIS — G8929 Other chronic pain: Secondary | ICD-10-CM | POA: Diagnosis not present

## 2023-05-31 DIAGNOSIS — M48062 Spinal stenosis, lumbar region with neurogenic claudication: Secondary | ICD-10-CM | POA: Diagnosis not present

## 2023-06-02 DIAGNOSIS — H2513 Age-related nuclear cataract, bilateral: Secondary | ICD-10-CM | POA: Diagnosis not present

## 2024-04-25 IMAGING — MR MR SHOULDER*L* W/O CM
4 of 5 series · 31 of 40 positions shown · non-contrast
Comparison: Radiographs 07/13/2021

CLINICAL DATA: Left shoulder pain after an injury while pushing up
from the ground.

EXAM:
MRI OF THE LEFT SHOULDER WITHOUT CONTRAST
TECHNIQUE: Multiplanar, multisequence MR imaging of the shoulder was performed.
No intravenous contrast was administered.

[Series 5: T2 fat-sat · axial · left · 4.0mm · 0.44mm/px · z∈[-70,+41]mm · 8 of 26 slices shown (1 of 3)]
[im 1/26]
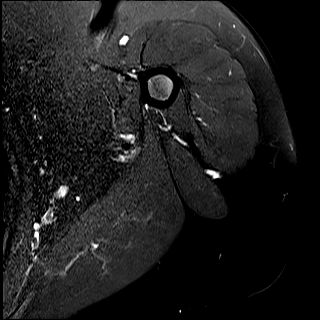
[im 4/26]
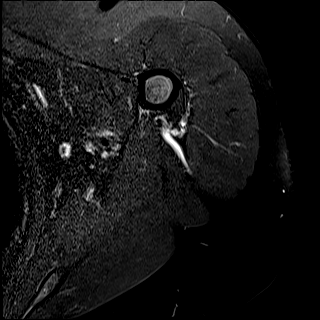
[im 8/26]
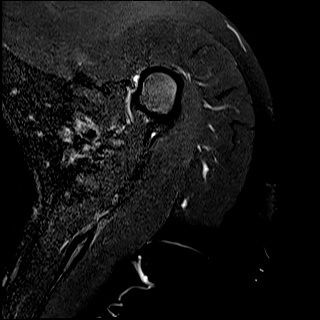
[im 11/26]
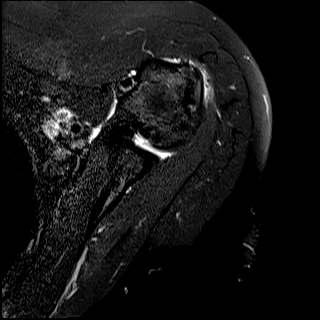
[im 15/26]
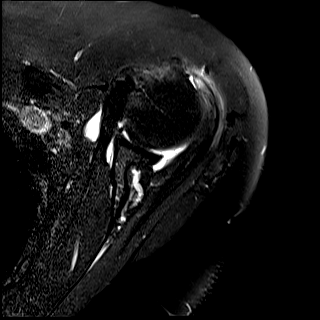
[im 18/26]
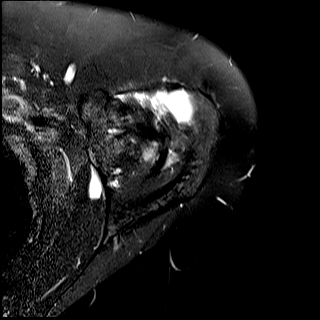
[im 22/26]
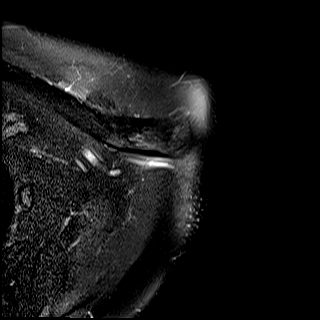
[im 26/26]
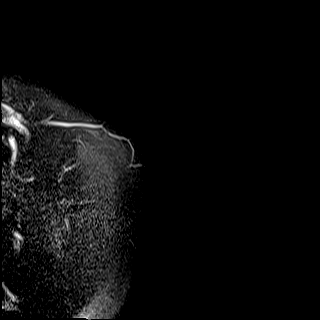

[Series 6: PD · sagittal · left · 4.0mm · 0.44mm/px · 9 of 26 slices shown]
[im 1/26]
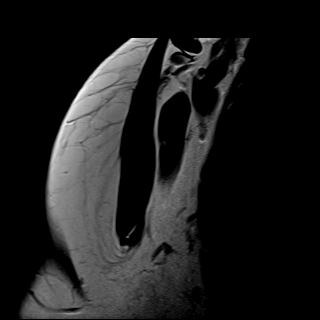
[im 4/26]
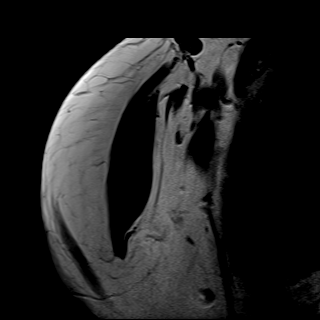
[im 7/26]
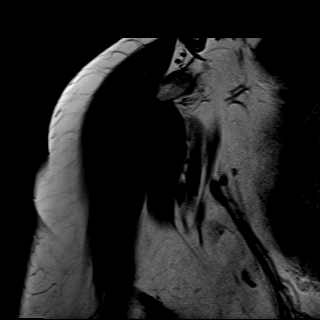
[im 10/26]
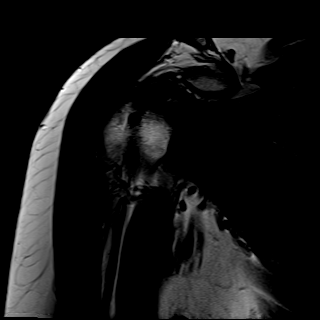
[im 13/26]
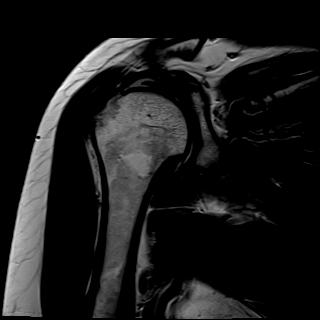
[im 16/26]
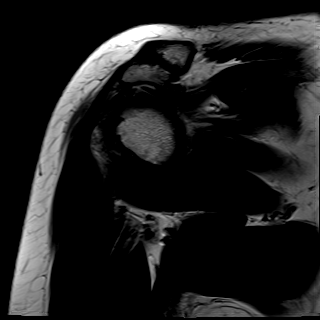
[im 19/26]
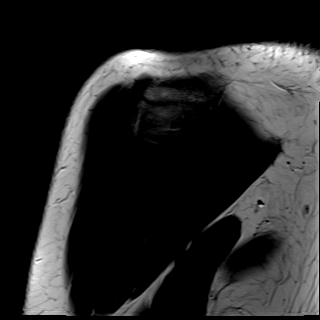
[im 22/26]
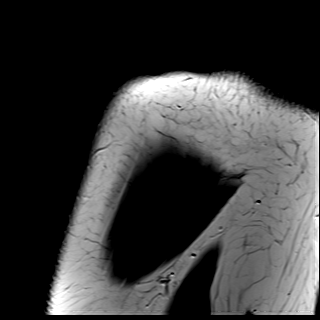
[im 26/26]
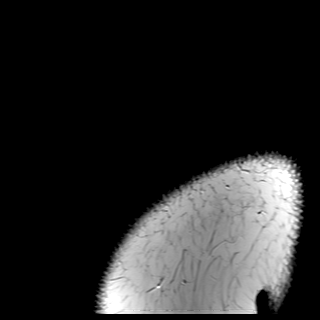

[Series 12: T2 fat-sat · sagittal · left · 4.0mm · 0.44mm/px · 9 of 26 slices shown (2 of 3)]
[im 1/26]
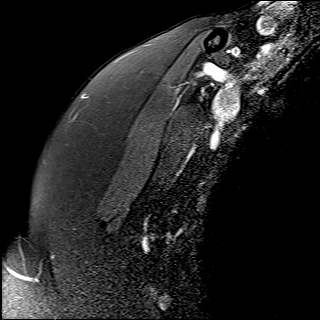
[im 4/26]
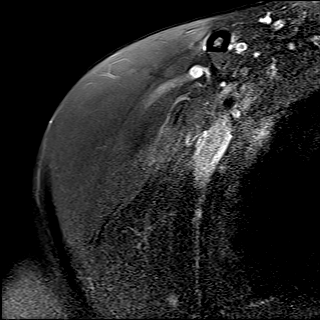
[im 7/26]
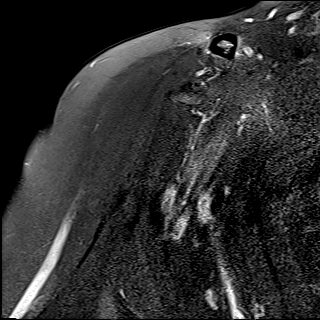
[im 10/26]
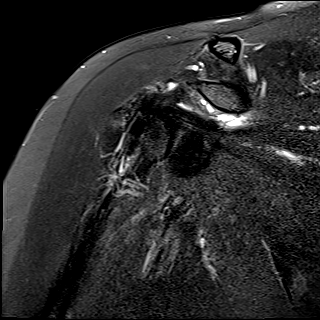
[im 13/26]
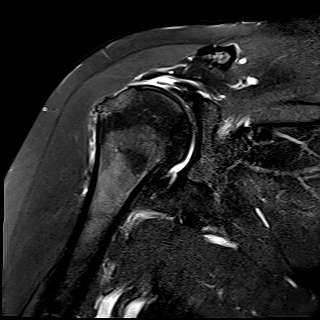
[im 16/26]
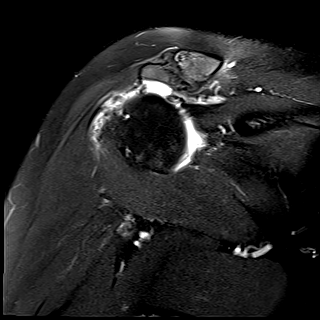
[im 19/26]
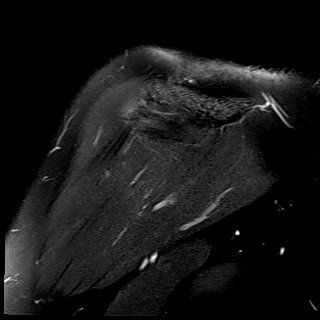
[im 22/26]
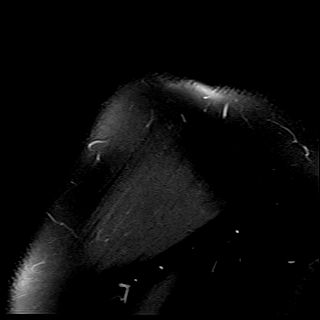
[im 26/26]
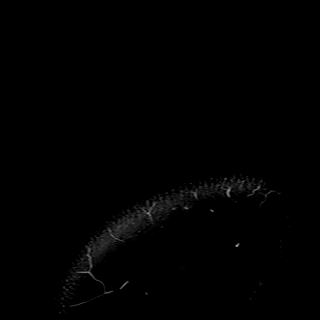

[Series 13: T2 fat-sat · oblique · left · 4.0mm · 0.22mm/px · 5 of 22 slices shown (3 of 3)]
[im 1/22]
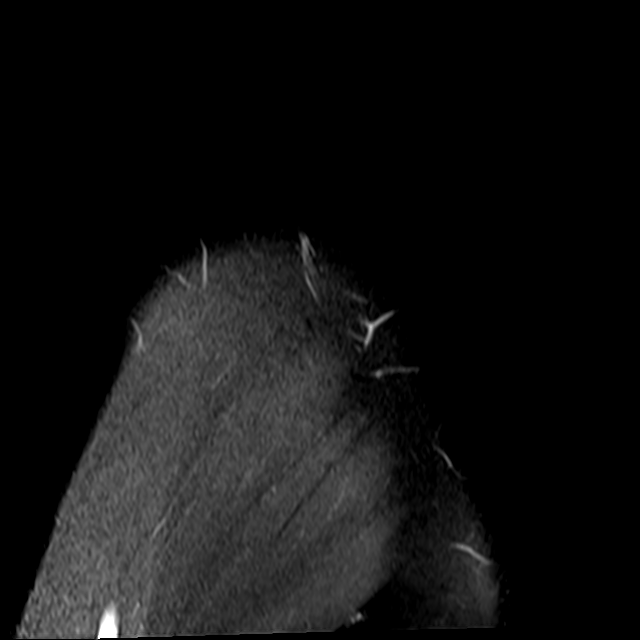
[im 4/22]
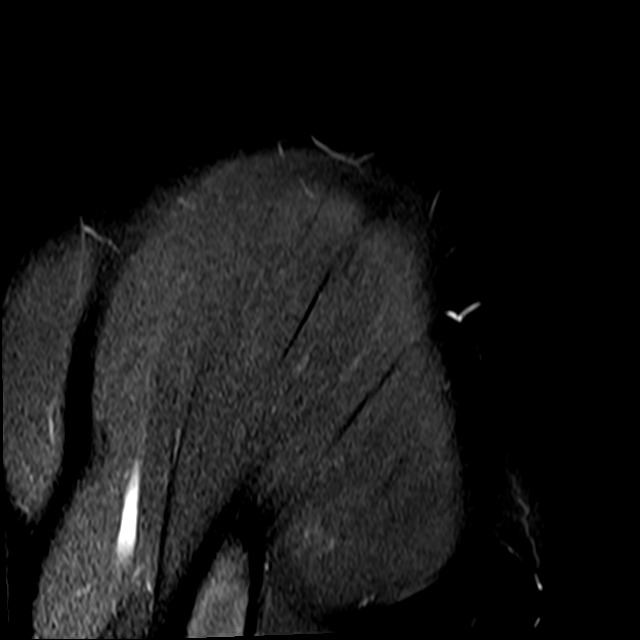
[im 8/22]
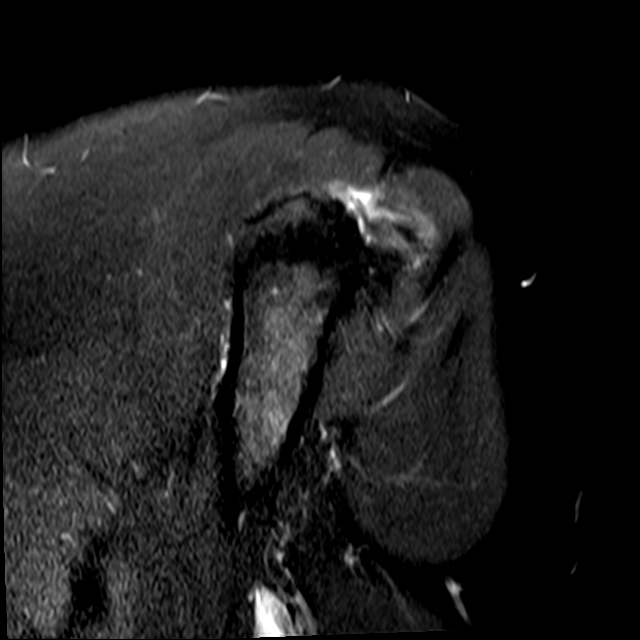
[im 11/22]
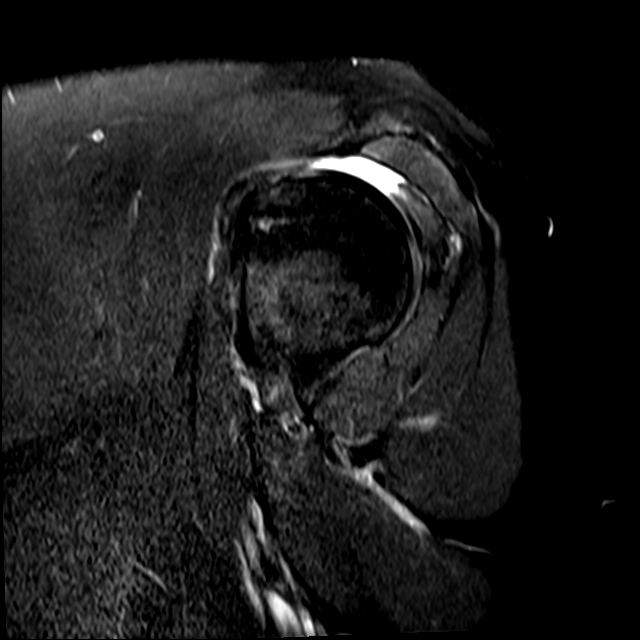
[im 18/22]
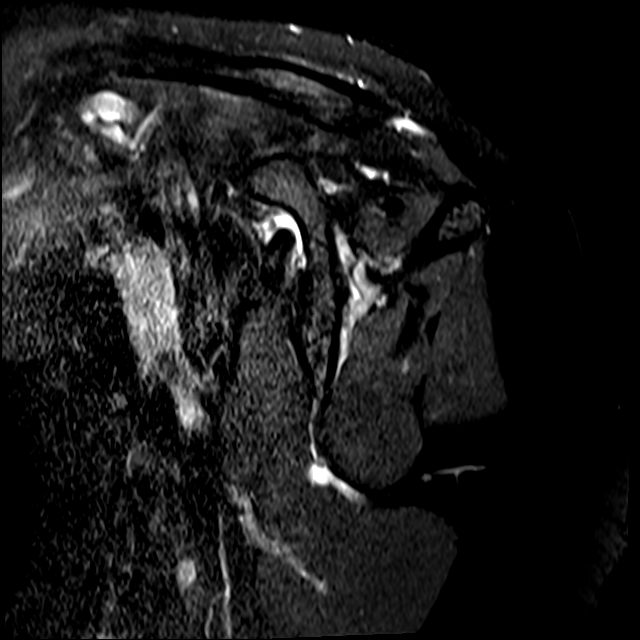

[31 of 40 positions shown; findings below may reference images not displayed]

FINDINGS: The patient tells the shoulder in an unusually internally rotated
position during the exam.

Rotator cuff: Full-thickness full width rupture of the supraspinatus
tendon, with the supraspinatus retracted about 3.5 cm nearly to the
plane of the glenoid.

Moderate to prominent distal infraspinatus tendinopathy.

Muscles:  Unremarkable

Biceps long head: Moderate tendinopathy of the intra-articular
segment.

Acromioclavicular Joint: Moderate spurring and mild subcortical
marrow edema. Type II acromion. As expected, there is a small amount
of fluid in the subacromial subdeltoid bursa and the subcoracoid
bursa.

Glenohumeral Joint: Mild degenerative chondral thinning.

Labrum:  Unremarkable

Bones: No significant extra-articular osseous abnormalities
identified.

Other: No supplemental non-categorized findings.
IMPRESSION: 1. Full-thickness full width rupture of the supraspinatus tendon,
retracted back 3.5 cm. No current supraspinatus muscular atrophy or
edema.
2. Moderate to prominent distal infraspinatus tendinopathy.
3. Moderate biceps tendinopathy.
4. Moderate degenerative AC joint arthropathy. Mild degenerative
glenohumeral chondral thinning.

## 2024-04-25 IMAGING — CR DG SHOULDER 2+V*L*
3 series · 3 of 3 positions shown · non-contrast
Comparison: None Available.

CLINICAL DATA: Shoulder injury with pain.

EXAM:
LEFT SHOULDER - 2+ VIEW

[shoulder ap neutral]
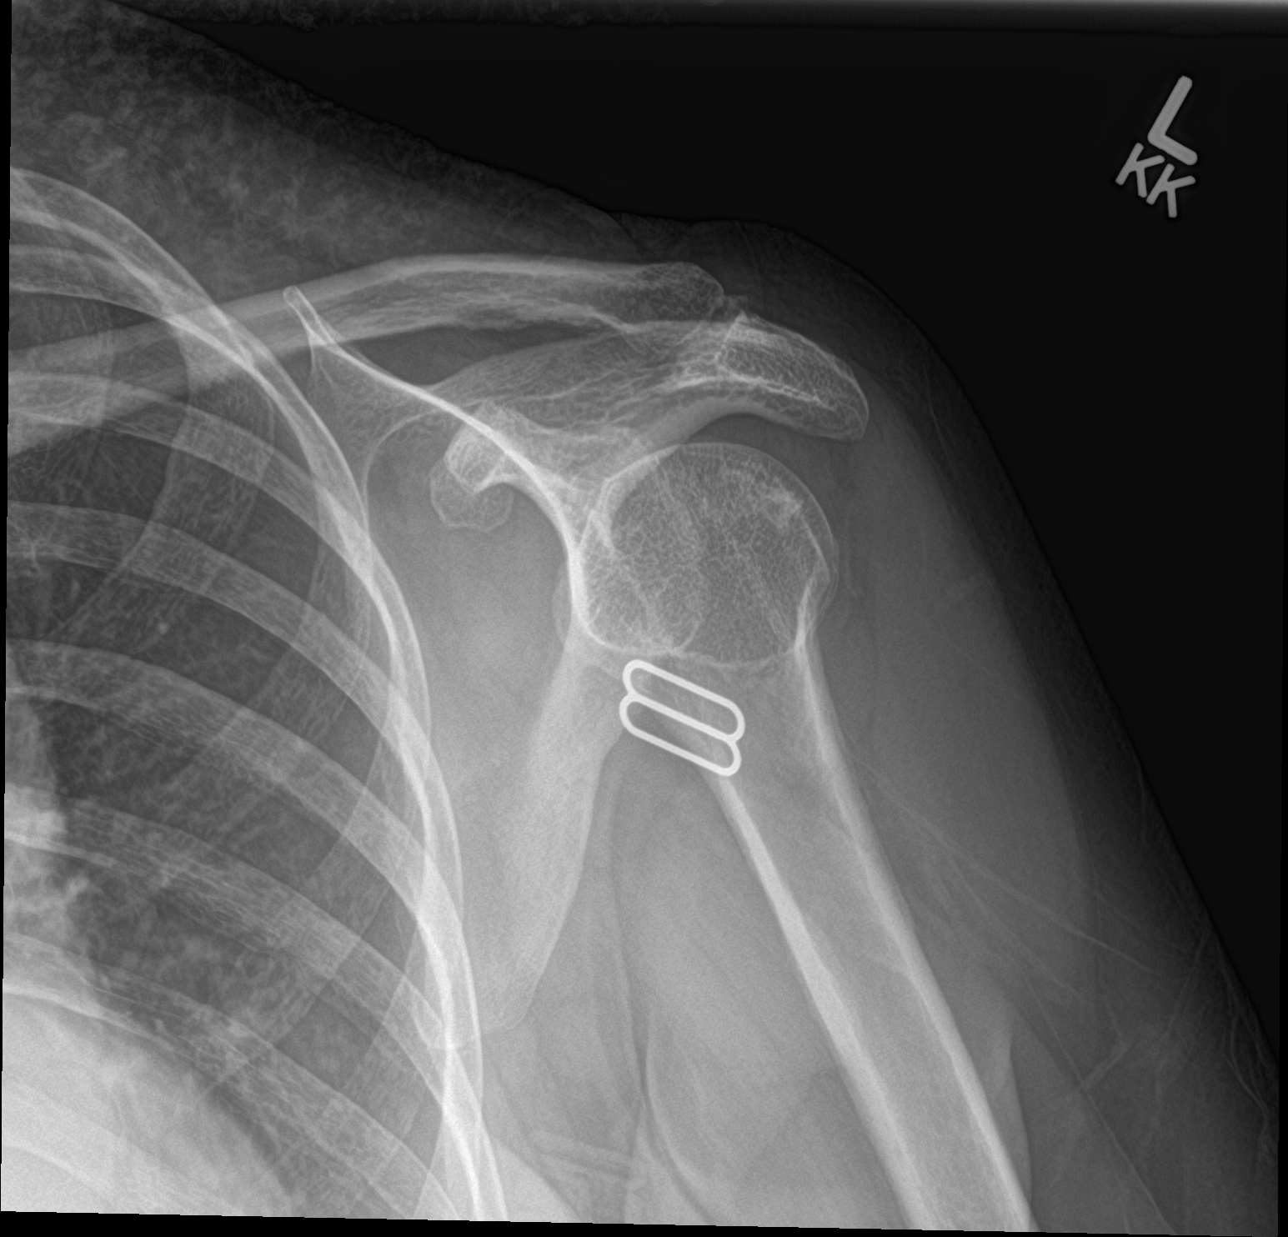

[shoulder y view]
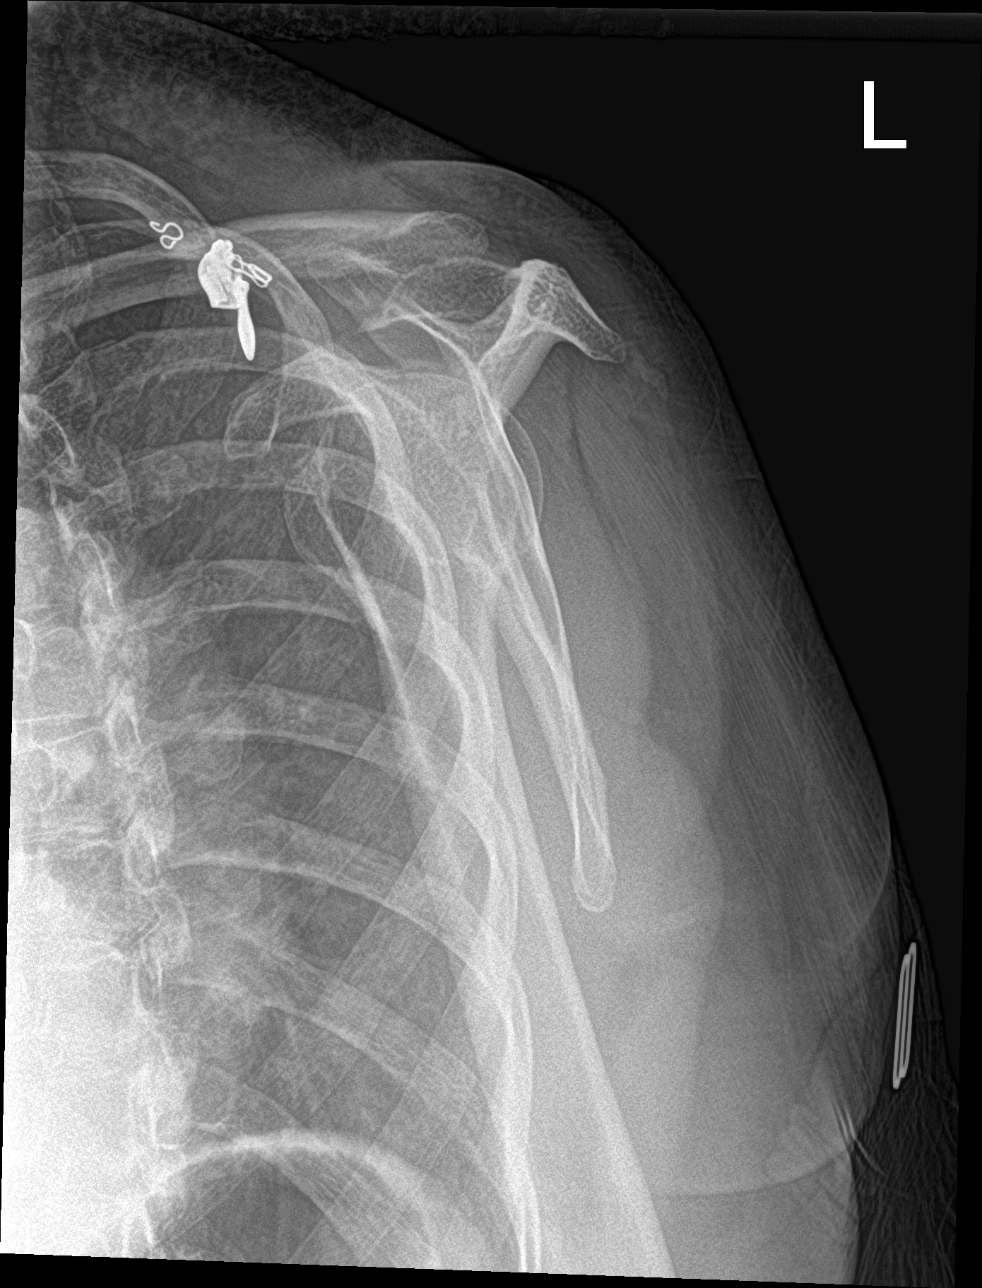

[shoulder grashey]
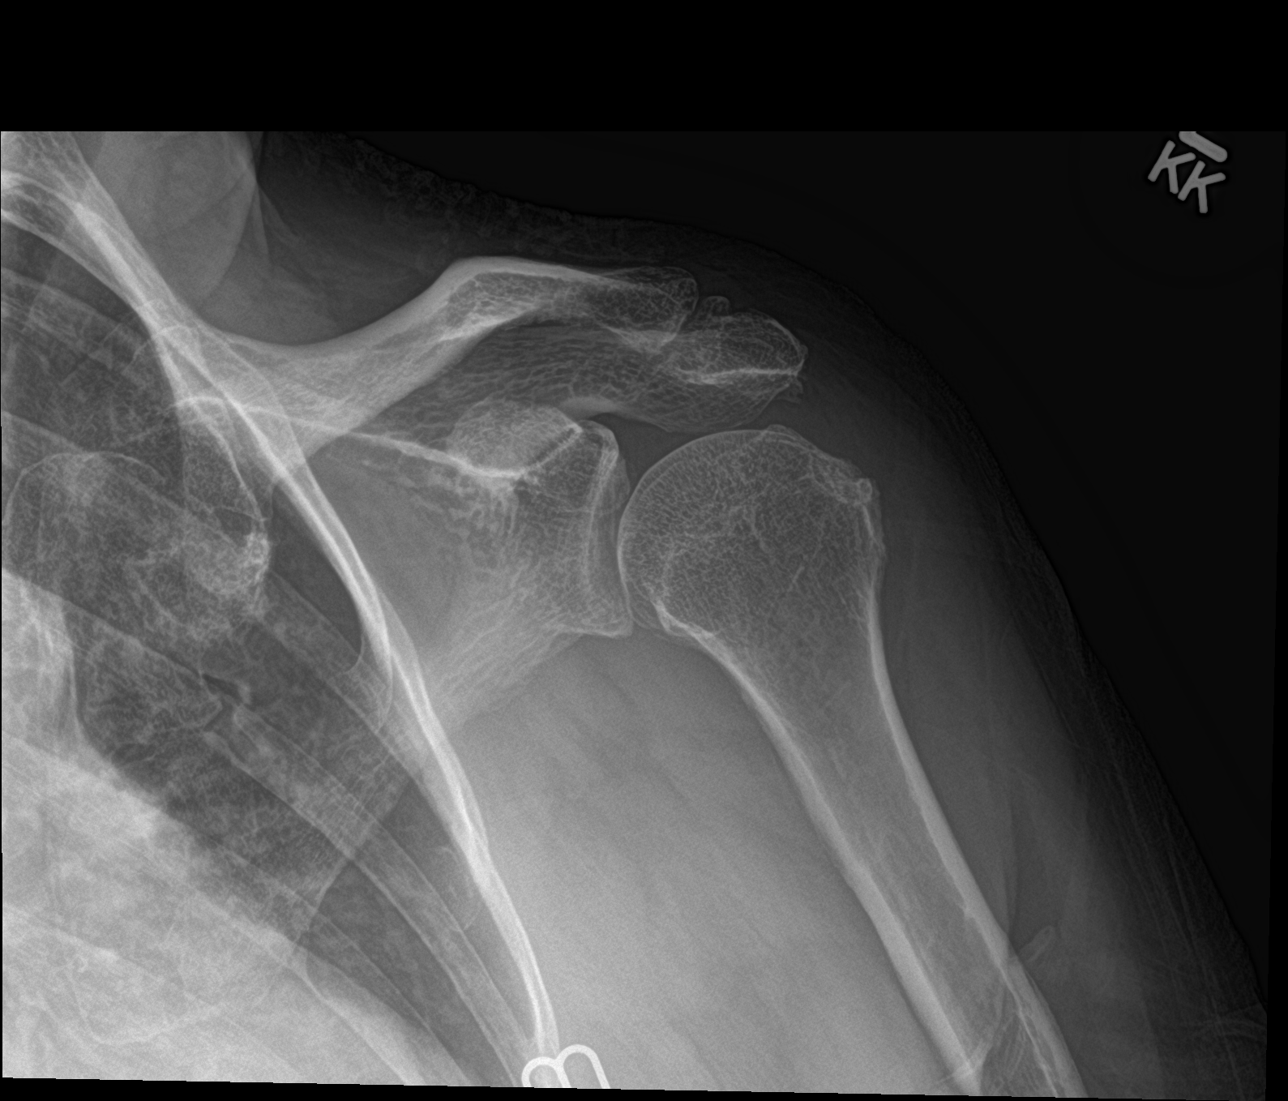

[3 of 3 positions shown; findings below may reference images not displayed]

FINDINGS: No evidence for an acute fracture. No shoulder separation or
dislocation. Degenerative changes are noted at the acromioclavicular
joint. Cortical irregularity at the rotator cuff insertion is
compatible with degenerative change.
IMPRESSION: 1. No acute bony abnormality.
2. Degenerative changes as described.
